# Patient Record
Sex: Male | Born: 1985 | Race: White | Hispanic: No | Marital: Single | Smoking: Never smoker
Health system: Southern US, Community
[De-identification: ages and names within clinical notes are randomized; demographics above are authoritative.]

## PROBLEM LIST (undated history)

## (undated) DIAGNOSIS — F32A Depression, unspecified: Secondary | ICD-10-CM

## (undated) DIAGNOSIS — I10 Essential (primary) hypertension: Secondary | ICD-10-CM

## (undated) DIAGNOSIS — F329 Major depressive disorder, single episode, unspecified: Secondary | ICD-10-CM

## (undated) DIAGNOSIS — J45909 Unspecified asthma, uncomplicated: Secondary | ICD-10-CM

## (undated) DIAGNOSIS — R55 Syncope and collapse: Secondary | ICD-10-CM

## (undated) DIAGNOSIS — R9431 Abnormal electrocardiogram [ECG] [EKG]: Secondary | ICD-10-CM

## (undated) DIAGNOSIS — M5134 Other intervertebral disc degeneration, thoracic region: Secondary | ICD-10-CM

## (undated) DIAGNOSIS — F4310 Post-traumatic stress disorder, unspecified: Secondary | ICD-10-CM

## (undated) DIAGNOSIS — F845 Asperger's syndrome: Secondary | ICD-10-CM

## (undated) DIAGNOSIS — F419 Anxiety disorder, unspecified: Secondary | ICD-10-CM

## (undated) HISTORY — DX: Anxiety disorder, unspecified: F41.9

## (undated) HISTORY — DX: Unspecified asthma, uncomplicated: J45.909

## (undated) HISTORY — DX: Essential (primary) hypertension: I10

## (undated) HISTORY — DX: Other intervertebral disc degeneration, thoracic region: M51.34

## (undated) HISTORY — DX: Syncope and collapse: R55

## (undated) HISTORY — DX: Abnormal electrocardiogram (ECG) (EKG): R94.31

## (undated) HISTORY — DX: Post-traumatic stress disorder, unspecified: F43.10

## (undated) HISTORY — DX: Major depressive disorder, single episode, unspecified: F32.9

## (undated) HISTORY — DX: Depression, unspecified: F32.A

## (undated) HISTORY — DX: Asperger's syndrome: F84.5

---

## 2018-04-23 ENCOUNTER — Encounter: Payer: Self-pay | Admitting: Neurology

## 2018-04-24 ENCOUNTER — Ambulatory Visit: Payer: BLUE CROSS/BLUE SHIELD | Admitting: Neurology

## 2018-04-24 ENCOUNTER — Encounter: Payer: Self-pay | Admitting: Neurology

## 2018-04-24 VITALS — BP 155/105 | HR 72 | Ht 67.0 in | Wt 169.0 lb

## 2018-04-24 DIAGNOSIS — H8149 Vertigo of central origin, unspecified ear: Secondary | ICD-10-CM | POA: Diagnosis not present

## 2018-04-24 DIAGNOSIS — H532 Diplopia: Secondary | ICD-10-CM

## 2018-04-24 DIAGNOSIS — R2681 Unsteadiness on feet: Secondary | ICD-10-CM | POA: Diagnosis not present

## 2018-04-24 DIAGNOSIS — H814 Vertigo of central origin: Secondary | ICD-10-CM

## 2018-04-24 DIAGNOSIS — G3281 Cerebellar ataxia in diseases classified elsewhere: Secondary | ICD-10-CM

## 2018-04-24 DIAGNOSIS — R270 Ataxia, unspecified: Secondary | ICD-10-CM | POA: Diagnosis not present

## 2018-04-24 NOTE — Patient Instructions (Signed)
Diplopia Diplopia is the condition of having double vision or seeing two of a single object. There are many causes of diplopia. Some are not dangerous and can be easily corrected. Diplopia may also be a symptom of a serious medical problem. There are two types of diplopia.  Monocular diplopia. This is double vision that affects only one eye. Monocular diplopia is often caused by a clouding of the lens in your eye (cataract) or by disruptions in the way that your eye focuses light.  Binocular diplopia. This is double vision that affects both eyes. However, when you shut one eye, the double vision will go away. Binocular diplopia may be more serious. It can be caused by: ? Problems with the nerves or muscles that are responsible for eye movement. ? Neurologic diseases. ? Thyroid problems. ? Tumors. ? An infection near your eyes. ? A stroke.  You may need to see a health care provider who specializes in eye conditions (ophthalmologist) or a nerve specialist (neurologist) to find the cause. Follow these instructions at home:  Tell your health care provider about any changes in your vision.  Do not drive or operate heavy machinery if diplopia interferes with your vision.  Keep all follow-up visits as directed by your health care provider. This is important. Contact a health care provider if:  Your diplopia gets worse.  You develop any other symptoms along with your diplopia, such as: ? Weakness. ? Numbness. ? Headache. ? Eye pain. ? Clumsiness. ? Nausea. ? Drooping eyelids. ? Abnormal movement of one of your eyes. Get help right away if:  You have sudden vision loss.  You suddenly get a very bad headache.  You have sudden weakness or numbness.  You suddenly lose the ability to speak, understand speech, or both. This information is not intended to replace advice given to you by your health care provider. Make sure you discuss any questions you have with your health care  provider. Document Released: 07/26/2004 Document Revised: 03/01/2016 Document Reviewed: 08/18/2014 Elsevier Interactive Patient Education  2018 [employer] Vertigo Vertigo is the feeling that you or your surroundings are moving when they are not. Vertigo can be dangerous if it occurs while you are doing something that could endanger you or others, such as driving. What are the causes? This condition is caused by a disturbance in the signals that are sent by your body's sensory systems to your brain. Different causes of a disturbance can lead to vertigo, including:  Infections, especially in the inner ear.  A bad reaction to a drug, or misuse of alcohol and medicines.  Withdrawal from drugs or alcohol.  Quickly changing positions, as when lying down or rolling over in bed.  Migraine headaches.  Decreased blood flow to the brain.  Decreased blood pressure.  Increased pressure in the brain from a head or neck injury, stroke, infection, tumor, or bleeding.  Central nervous system disorders.  What are the signs or symptoms? Symptoms of this condition usually occur when you move your head or your eyes in different directions. Symptoms may start suddenly, and they usually last for less than a minute. Symptoms may include:  Loss of balance and falling.  Feeling like you are spinning or moving.  Feeling like your surroundings are spinning or moving.  Nausea and vomiting.  Blurred vision or double vision.  Difficulty hearing.  Slurred speech.  Dizziness.  Involuntary eye movement (nystagmus).  Symptoms can be mild and cause only slight annoyance, or they can be  severe and interfere with daily life. Episodes of vertigo may return (recur) over time, and they are often triggered by certain movements. Symptoms may improve over time. How is this diagnosed? This condition may be diagnosed based on medical history and the quality of your nystagmus. Your health care provider may  test your eye movements by asking you to quickly change positions to trigger the nystagmus. This may be called the Dix-Hallpike test, head thrust test, or roll test. You may be referred to a health care provider who specializes in ear, nose, and throat (ENT) problems (otolaryngologist) or a provider who specializes in disorders of the central nervous system (neurologist). You may have additional testing, including:  A physical exam.  Blood tests.  MRI.  A CT scan.  An electrocardiogram (ECG). This records electrical activity in your heart.  An electroencephalogram (EEG). This records electrical activity in your brain.  Hearing tests.  How is this treated? Treatment for this condition depends on the cause and the severity of the symptoms. Treatment options include:  Medicines to treat nausea or vertigo. These are usually used for severe cases. Some medicines that are used to treat other conditions may also reduce or eliminate vertigo symptoms. These include: ? Medicines that control allergies (antihistamines). ? Medicines that control seizures (anticonvulsants). ? Medicines that relieve depression (antidepressants). ? Medicines that relieve anxiety (sedatives).  Head movements to adjust your inner ear back to normal. If your vertigo is caused by an ear problem, your health care provider may recommend certain movements to correct the problem.  Surgery. This is rare.  Follow these instructions at home: Safety  Move slowly.Avoid sudden body or head movements.  Avoid driving.  Avoid operating heavy machinery.  Avoid doing any tasks that would cause danger to you or others if you would have a vertigo episode during the task.  If you have trouble walking or keeping your balance, try using a cane for stability. If you feel dizzy or unstable, sit down right away.  Return to your normal activities as told by your health care provider. Ask your health care provider what activities are  safe for you. General instructions  Take over-the-counter and prescription medicines only as told by your health care provider.  Avoid certain positions or movements as told by your health care provider.  Drink enough fluid to keep your urine clear or pale yellow.  Keep all follow-up visits as told by your health care provider. This is important. Contact a health care provider if:  Your medicines do not relieve your vertigo or they make it worse.  You have a fever.  Your condition gets worse or you develop new symptoms.  Your family or friends notice any behavioral changes.  Your nausea or vomiting gets worse.  You have numbness or a "pins and needles" sensation in part of your body. Get help right away if:  You have difficulty moving or speaking.  You are always dizzy.  You faint.  You develop severe headaches.  You have weakness in your hands, arms, or legs.  You have changes in your hearing or vision.  You develop a stiff neck.  You develop sensitivity to light. This information is not intended to replace advice given to you by your health care provider. Make sure you discuss any questions you have with your health care provider. Document Released: 07/04/2005 Document Revised: 03/07/2016 Document Reviewed: 01/17/2015 Elsevier Interactive Patient Education  [employer].
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
## 2018-04-25 ENCOUNTER — Telehealth: Payer: Self-pay | Admitting: *Deleted

## 2018-04-25 ENCOUNTER — Telehealth: Payer: Self-pay | Admitting: Neurology

## 2018-04-25 LAB — COMPREHENSIVE METABOLIC PANEL
ALK PHOS: 89 IU/L (ref 39–117)
ALT: 45 IU/L — AB (ref 0–44)
AST: 22 IU/L (ref 0–40)
Albumin/Globulin Ratio: 2 (ref 1.2–2.2)
Albumin: 4.8 g/dL (ref 3.5–5.5)
BILIRUBIN TOTAL: 0.3 mg/dL (ref 0.0–1.2)
BUN/Creatinine Ratio: 24 — ABNORMAL HIGH (ref 9–20)
BUN: 16 mg/dL (ref 6–20)
CHLORIDE: 102 mmol/L (ref 96–106)
CO2: 23 mmol/L (ref 20–29)
Calcium: 9.9 mg/dL (ref 8.7–10.2)
Creatinine, Ser: 0.66 mg/dL — ABNORMAL LOW (ref 0.76–1.27)
GFR calc non Af Amer: 130 mL/min/{1.73_m2} (ref >59)
GFR, EST AFRICAN AMERICAN: 150 mL/min/{1.73_m2} (ref >59)
GLUCOSE: 104 mg/dL — AB (ref 65–99)
Globulin, Total: 2.4 g/dL (ref 1.5–4.5)
POTASSIUM: 4.5 mmol/L (ref 3.5–5.2)
Sodium: 139 mmol/L (ref 134–144)
Total Protein: 7.2 g/dL (ref 6.0–8.5)

## 2018-04-25 NOTE — Telephone Encounter (Addendum)
Discussed pt's lab results with him. Advised him to be sure he is hydrated as it has been very hot outside and his BUN level showed some dehydration. Pt ok to proceed with MRIs involving contrast. He verbalized understanding and appreciation. He asked if he needed an appt in a month to go over the MRIs. Advised him RN would look into this and we will call him back. Pt appreciative.   ----- Message from Melvyn Novasarmen Dohmeier, MD sent at 04/25/2018 10:22 AM EDT ----- Non fast blood glucose is not abnormal, BUN elevation is sign of dehydration, normal renal function, can undergo contrast MRI cervical spine and Brain.   Cc Antony OdeaJason Vaughn, NP

## 2018-04-25 NOTE — Telephone Encounter (Signed)
MRI scheduled for 7-30 ? Marland Kitchen. Let's wait for results first, if abnormal I will work him in urgently.

## 2018-04-25 NOTE — Telephone Encounter (Signed)
Patient called back and I informed him of his appointment. He is aware of time & day.

## 2018-04-25 NOTE — Telephone Encounter (Signed)
Patient is scheduled at [facility] for Tues 05/06/18 and to arrive at 9:45 AM.. I called the patient to inform him of this information and his voicemail box has not been set up.  [employer] Auth: NPR Ref # [id number]. Spoke to [place] with [employer].

## 2018-04-28 NOTE — Telephone Encounter (Signed)
Called pt and informed him that for now we will wait and see what the MRI results are first. He verbalized understanding and had no further questions.

## 2018-05-06 ENCOUNTER — Ambulatory Visit (HOSPITAL_COMMUNITY)
Admission: RE | Admit: 2018-05-06 | Discharge: 2018-05-06 | Disposition: A | Discharge status: Home / Self Care | Payer: BLUE CROSS/BLUE SHIELD | Source: Ambulatory Visit | Attending: Neurology | Admitting: Neurology

## 2018-05-06 DIAGNOSIS — H532 Diplopia: Secondary | ICD-10-CM

## 2018-05-06 DIAGNOSIS — G3281 Cerebellar ataxia in diseases classified elsewhere: Secondary | ICD-10-CM | POA: Insufficient documentation

## 2018-05-06 MED ORDER — GADOBENATE DIMEGLUMINE 529 MG/ML IV SOLN
16.0000 mL | Freq: Once | INTRAVENOUS | Status: AC | PRN
  Administered 2018-05-06: 16 mL via INTRAVENOUS

## 2018-05-08 ENCOUNTER — Other Ambulatory Visit: Payer: Self-pay | Admitting: Neurology

## 2018-05-08 ENCOUNTER — Telehealth: Payer: Self-pay | Admitting: Neurology

## 2018-05-08 DIAGNOSIS — R2681 Unsteadiness on feet: Secondary | ICD-10-CM

## 2018-05-08 DIAGNOSIS — H532 Diplopia: Secondary | ICD-10-CM

## 2018-05-08 NOTE — Telephone Encounter (Signed)
Called the patient and reviewed with him the results of the MRI brain and cervical spine. Informed him that Dr Vickey Hugerohmeier recommended the patient come in and have a lab drawn to assess for myasthenia gravis. Pt inquired if that would be what could be causing the back pain. Informed him that if this lab work comes back negative then we would have him follow back up with PCP and get neurosurgical referral to assess if there is a spinal cord issue going on. Pt verbalized understanding.

## 2018-05-08 NOTE — Telephone Encounter (Signed)
-----   Message from Melvyn Novasarmen Dohmeier, MD sent at 05/06/2018  5:47 PM EDT ----- Mostly normal cervical spine MRI- no evidence of demyelination/ MS here as well as in Brain MRI with contrast and without.  No stroke, no brain injury . Consider Anti Ach receptor Ab - myasthenia gravis- if negative , needs to return to PCP and recommend ophthalmological evaluation for diplopia.

## 2020-02-27 IMAGING — MR MR HEAD WO/W CM
10 of 15 series · 26 of 48 positions shown · IV contrast (multihance)
Comparison: [HOSPITAL] [facility] without contrast
12/02/2016. Cervical spine MRI today reported separately.

CLINICAL DATA: 32-year-old male with diplopia being evaluated for
multiple sclerosis.

EXAM:
MRI HEAD WITHOUT AND WITH CONTRAST
TECHNIQUE: Multiplanar, multiecho pulse sequences of the brain and surrounding
structures were obtained without and with intravenous contrast.
CONTRAST:  16mL MULTIHANCE GADOBENATE DIMEGLUMINE 529 MG/ML IV SOLN

[Series 2: DWI · axial · 3.0mm · 0.75mm/px · z∈[-123,+36]mm · 3 of 55 slices shown (1 of 4)]
[im 1/55]
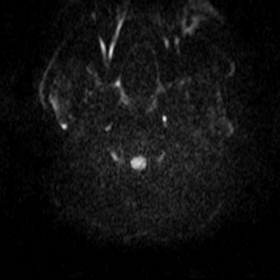
[im 28/55]
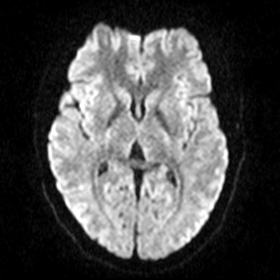
[im 55/55]
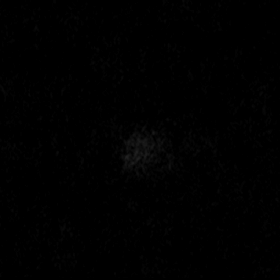

[Series 3: DWI · axial · 3.0mm · 0.75mm/px · z∈[-123,+36]mm · 4 of 53 slices shown (2 of 4)]
[im 1/53]
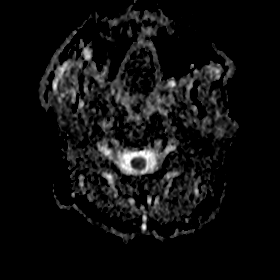
[im 18/53]
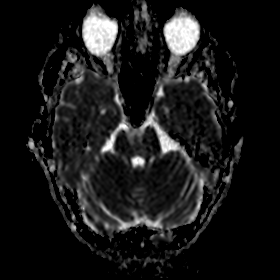
[im 35/53]
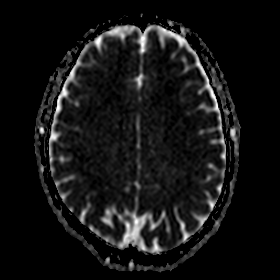
[im 53/53]
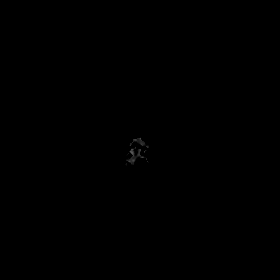

[Series 4: DWI · coronal · 5.0mm · 0.47mm/px · 2 of 34 slices shown (3 of 4)]
[im 1/34]
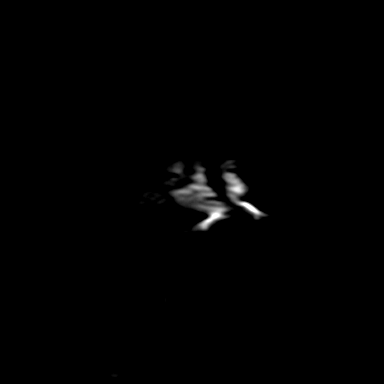
[im 34/34]
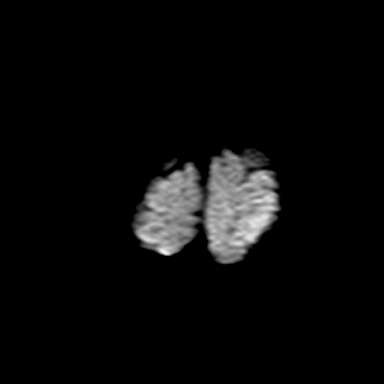

[Series 5: DWI · coronal · 5.0mm · 0.47mm/px · 2 of 34 slices shown (4 of 4)]
[im 1/34]
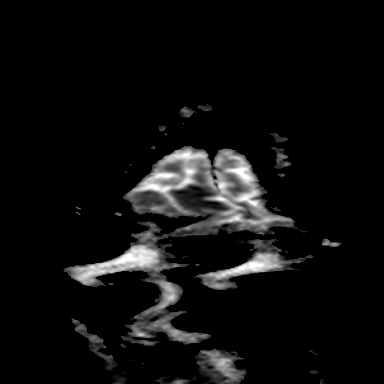
[im 34/34]
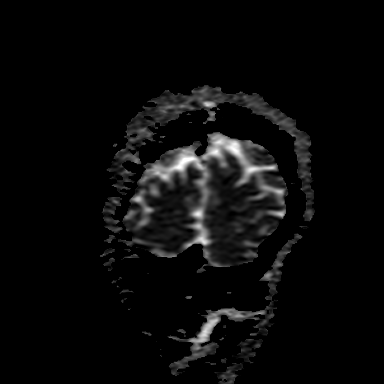

[Series 7: T2 · axial · 5.0mm · 0.49mm/px · z∈[-126,+13]mm · 2 of 23 slices shown (1 of 2)]
[im 1/23]
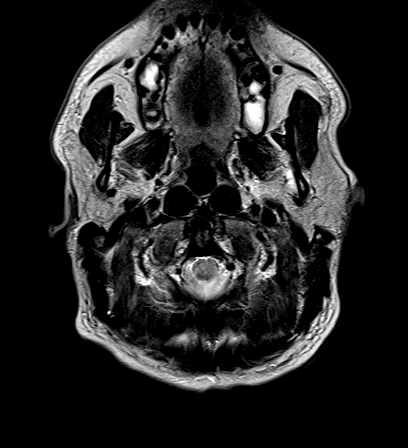
[im 23/23]
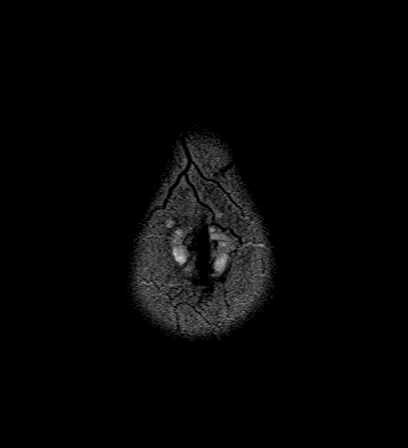

[Series 8: FLAIR · axial · 3.0mm · 0.34mm/px · z∈[-122,+11]mm · 3 of 47 slices shown]
[im 1/47]
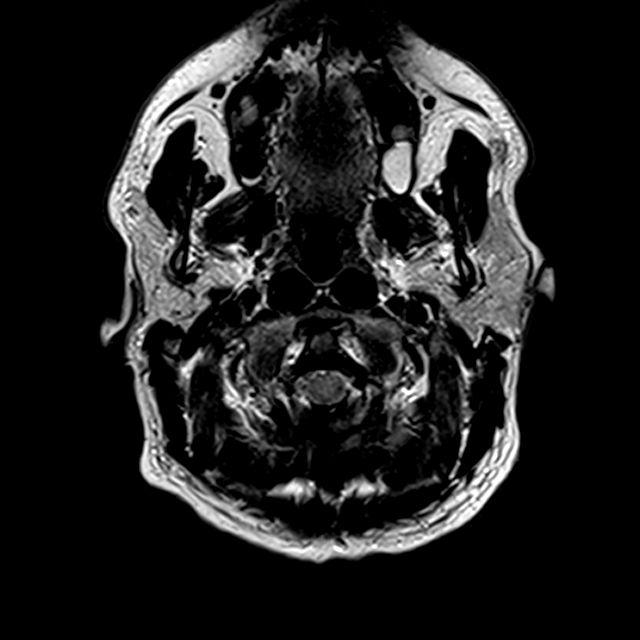
[im 24/47]
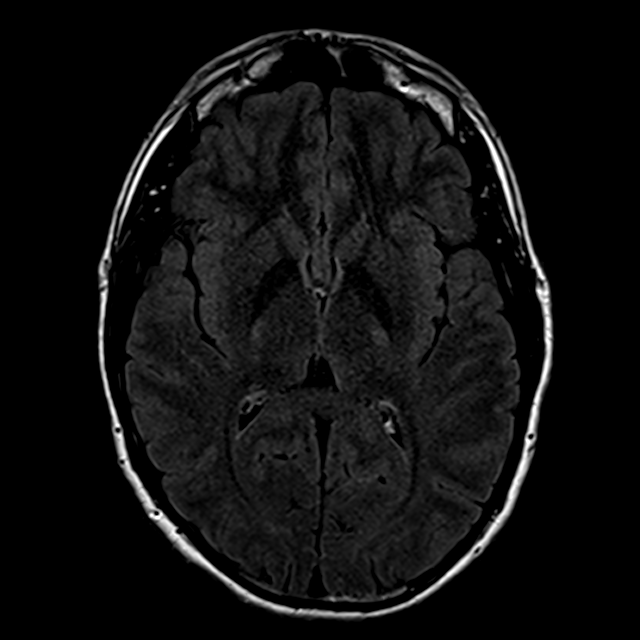
[im 47/47]
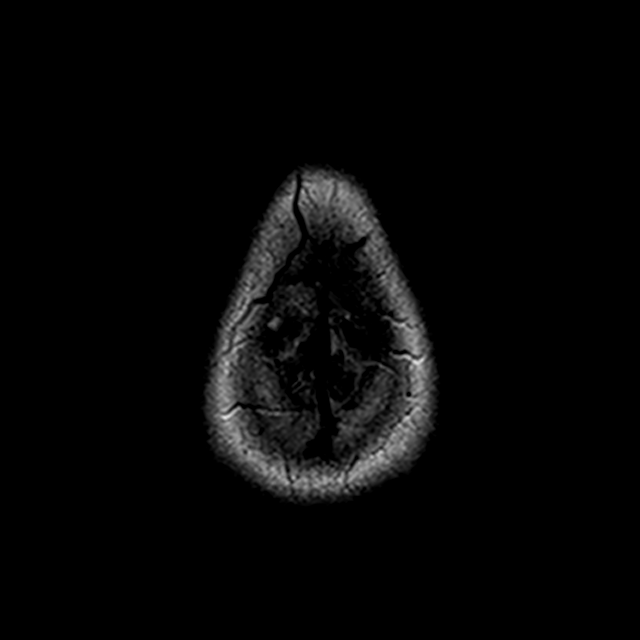

[Series 12: T2 · coronal · 5.0mm · 0.42mm/px · 1 of 28 slices shown (2 of 2)]
[im 1/28]
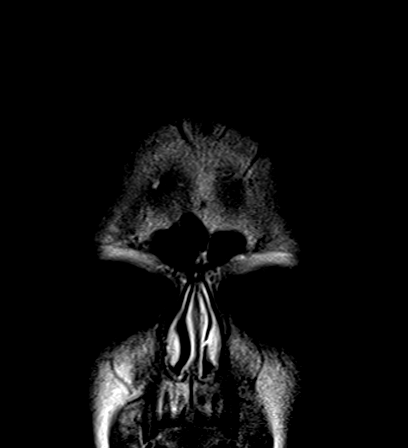

[Series 13: T1 post-contrast · axial · 2.0mm · 0.43mm/px · z∈[-134,+19]mm · 6 of 80 slices shown (1 of 3)]
[im 1/80]
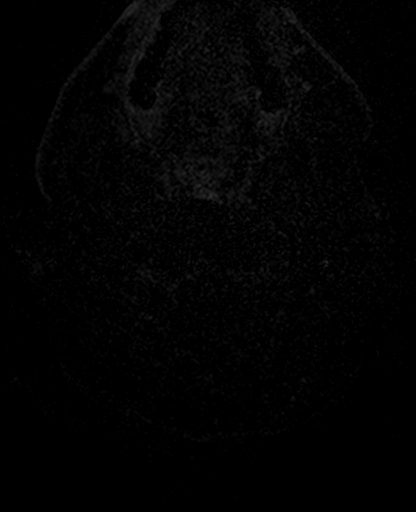
[im 16/80]
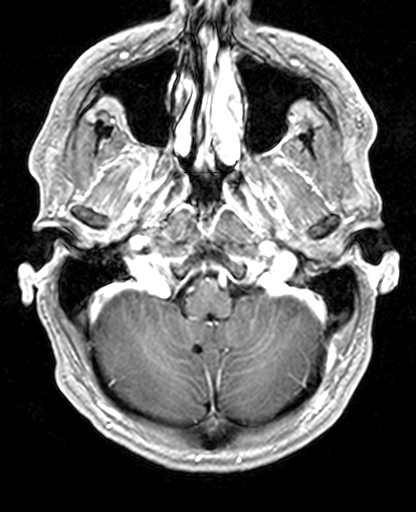
[im 32/80]
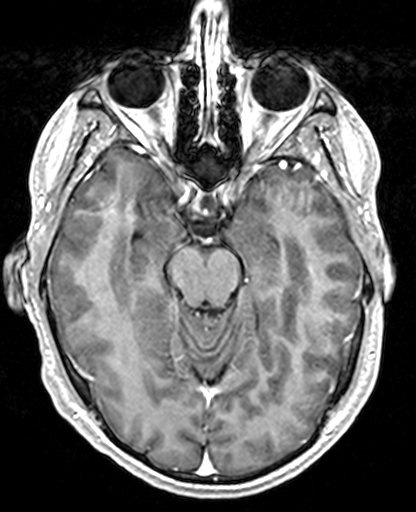
[im 48/80]
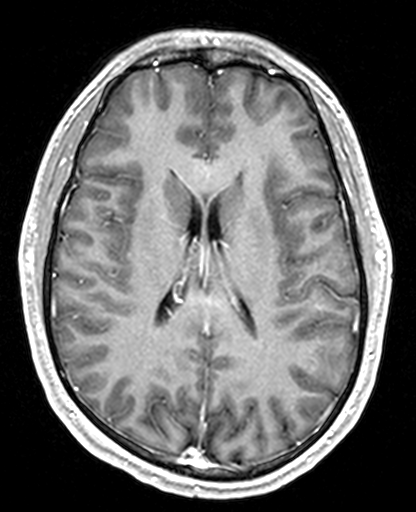
[im 64/80]
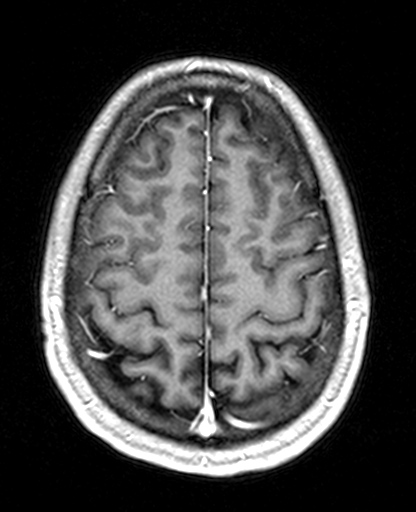
[im 80/80]
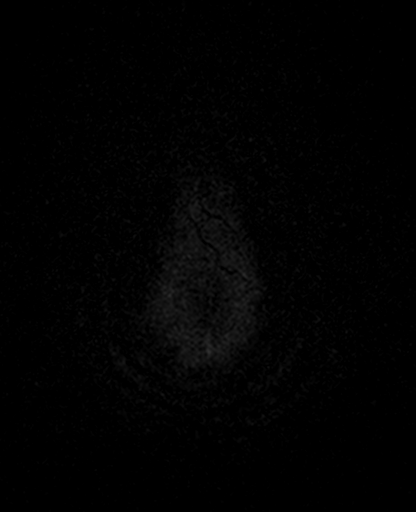

[Series 14: T1 post-contrast · coronal · 5.0mm · 0.36mm/px · 2 of 28 slices shown (2 of 3)]
[im 1/28]
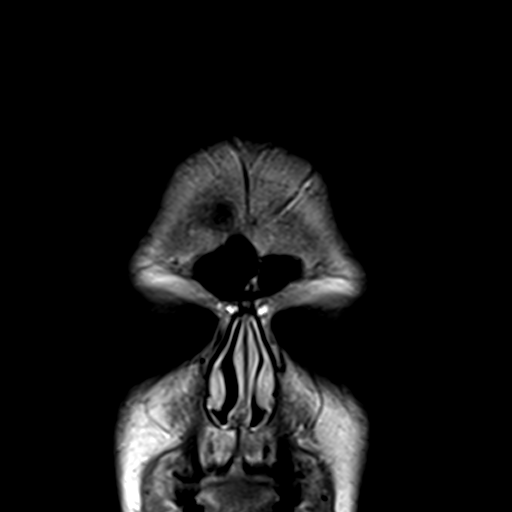
[im 28/28]
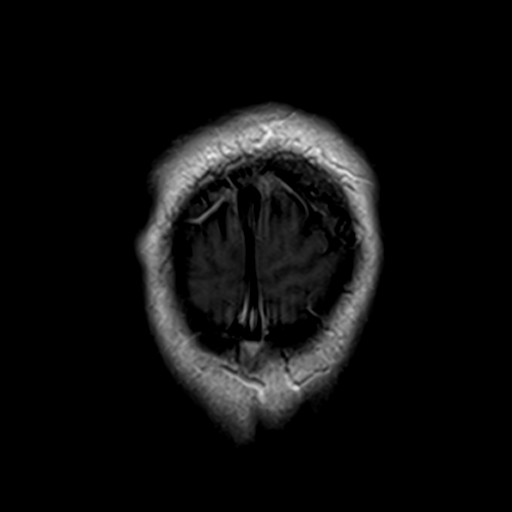

[Series 15: T1 post-contrast · sagittal · 5.0mm · 0.43mm/px · 1 of 20 slices shown (3 of 3)]
[im 1/20]
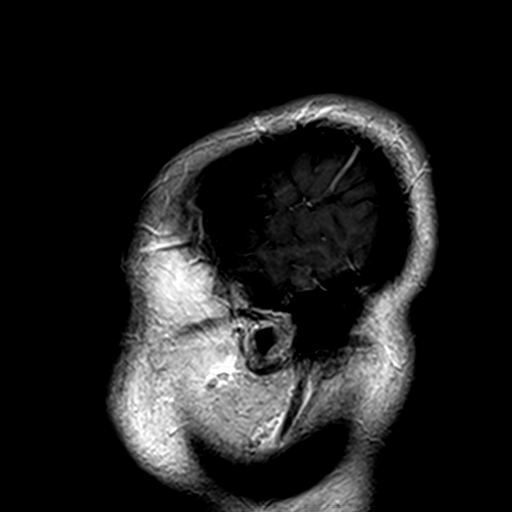

[26 of 48 positions shown; findings below may reference images not displayed]

FINDINGS: Brain: Cerebral volume is within normal limits. No restricted
diffusion to suggest acute infarction. No midline shift, mass
effect, evidence of mass lesion, ventriculomegaly, extra-axial
collection or acute intracranial hemorrhage. Cervicomedullary
junction and pituitary are within normal limits.

Gray and white matter signal is within normal limits throughout the
brain. There are no white matter lesions. No encephalomalacia or
chronic cerebral blood products identified. The deep gray matter
nuclei, brainstem, and cerebellum appear normal. No abnormal
enhancement identified.

Vascular: Major intracranial vascular flow voids are preserved. The
distal left vertebral artery appears mildly dominant. The major
dural venous sinuses are enhancing and appear patent.

Skull and upper cervical spine: Cervical spine findings are reported
separately today. Visualized bone marrow signal is within normal
limits.

Sinuses/Orbits: The orbits soft tissues appears symmetric and
normal.

Trace paranasal sinus mucosal thickening and small maxillary sinus
mucous retention cysts.

Other: The mastoid air cells are clear. Visible internal auditory
structures appear normal. Scalp and face soft tissues appear
negative.
IMPRESSION: Normal MRI appearance of the brain.
